# Patient Record
Sex: Female | Born: 1955 | Race: White | Hispanic: No | Marital: Married | Smoking: Never smoker
Health system: Southern US, Community
[De-identification: ages and names within clinical notes are randomized; demographics above are authoritative.]

## PROBLEM LIST (undated history)

## (undated) DIAGNOSIS — K5790 Diverticulosis of intestine, part unspecified, without perforation or abscess without bleeding: Secondary | ICD-10-CM

## (undated) HISTORY — DX: Diverticulosis of intestine, part unspecified, without perforation or abscess without bleeding: K57.90

---

## 2017-01-05 ENCOUNTER — Emergency Department (HOSPITAL_COMMUNITY)
Admission: EM | Admit: 2017-01-05 | Discharge: 2017-01-05 | Disposition: A | Discharge status: Home / Self Care | Payer: Managed Care, Other (non HMO) | Attending: Emergency Medicine | Admitting: Emergency Medicine

## 2017-01-05 ENCOUNTER — Emergency Department (HOSPITAL_COMMUNITY): Payer: Managed Care, Other (non HMO)

## 2017-01-05 ENCOUNTER — Encounter (HOSPITAL_COMMUNITY): Payer: Self-pay | Admitting: Emergency Medicine

## 2017-01-05 DIAGNOSIS — Y929 Unspecified place or not applicable: Secondary | ICD-10-CM | POA: Diagnosis not present

## 2017-01-05 DIAGNOSIS — Y999 Unspecified external cause status: Secondary | ICD-10-CM | POA: Diagnosis not present

## 2017-01-05 DIAGNOSIS — Z79899 Other long term (current) drug therapy: Secondary | ICD-10-CM | POA: Insufficient documentation

## 2017-01-05 DIAGNOSIS — M546 Pain in thoracic spine: Secondary | ICD-10-CM

## 2017-01-05 DIAGNOSIS — W109XXA Fall (on) (from) unspecified stairs and steps, initial encounter: Secondary | ICD-10-CM | POA: Insufficient documentation

## 2017-01-05 DIAGNOSIS — Y9301 Activity, walking, marching and hiking: Secondary | ICD-10-CM | POA: Insufficient documentation

## 2017-01-05 DIAGNOSIS — W19XXXA Unspecified fall, initial encounter: Secondary | ICD-10-CM

## 2017-01-05 MED ORDER — TRAMADOL HCL 50 MG PO TABS: 50.0000 mg | ORAL_TABLET | Freq: Four times a day (QID) | ORAL | 0 refills | Status: AC | PRN

## 2017-01-05 NOTE — ED Triage Notes (Signed)
[place] Staying at daughters house for visit. Pt was going downstairs and slipped and fell. No dizziness or syncopy precipitated the fall.  Pt fell directly onto her bottom and felt an immediate sharp pain in back upon impact.  Pt never fell backwards or hit her back, it was more compression.  Pain was extreme, 10/10.  Pt was immobilized in place by EMS and carried from the stairs. Position has not changed since then.  Pt does not c/o numbness or tingling.  20  Left hand. Pt has received a total 150 mcg of fentanyl en route. Alert and oriented x 4.

## 2017-01-05 NOTE — ED Notes (Signed)
Bed: WA07 Expected date:  Expected time:  Means of arrival:  Comments: fall 

## 2017-01-05 NOTE — ED Notes (Addendum)
Patient ambulated to restroom with minimal pain with assistance.

## 2017-01-05 NOTE — Discharge Instructions (Signed)
Please read attached information. If you experience any new or worsening signs or symptoms please return to the emergency room for evaluation. Please follow-up with your primary care provider or specialist as discussed. Please use medication prescribed only as directed and discontinue taking if you have any concerning signs or symptoms.   °

## 2017-01-05 NOTE — ED Provider Notes (Signed)
[facility]-EMERGENCY DEPT Provider Note   CSN: [id number] Arrival date & time: 01/05/17  1228     History   Chief Complaint Chief Complaint  Patient presents with  . Fall    HPI Carmen Levine is a 61 y.o. female.  HPI    61 year old female presents today status post fall.  Patient is she is walking down steps when she slipped and landed on her bottom.  She notes immediate pain to the thoracic region of her spine.  She denies any pain from the site of the fall, denies any neurological deficits to the upper and lower extremities, denies any shortness of breath or chest pain.  Patient does note a significant past medical history of discectomy at L4-L5.  Full active range of motion of bilateral upper and lower extremities.  Patient brought via EMS, 150 fentanyl given in route.   History reviewed. No pertinent past medical history.  There are no active problems to display for this patient.   History reviewed. No pertinent surgical history.  OB History    No data available       Home Medications    Prior to Admission medications   Medication Sig Start Date End Date Taking? Authorizing Provider  traMADol (ULTRAM) 50 MG tablet Take 1 tablet (50 mg total) by mouth every 6 (six) hours as needed. 01/05/17   Eyvonne MechanicJeffrey Ellyana Crigler, PA-C    Family History History reviewed. No pertinent family history.  Social History Social History  Substance Use Topics  . Smoking status: Never Smoker  . Smokeless tobacco: Never Used  . Alcohol use Not on file     Allergies   Patient has no allergy information on record.   Review of Systems Review of Systems  All other systems reviewed and are negative.    Physical Exam Updated Vital Signs BP 131/77 (BP Location: Left Arm)   Pulse 71   Temp 98.7 F (37.1 C) (Oral)   Resp 16   Ht 5\' 7"  (1.702 m)   Wt 70.3 kg   SpO2 95%   BMI 24.28 kg/m   Physical Exam  Constitutional: She is oriented to person, place, and time. She appears  well-developed and well-nourished.  HENT:  Head: Normocephalic and atraumatic.  Eyes: Conjunctivae are normal. Pupils are equal, round, and reactive to light. Right eye exhibits no discharge. Left eye exhibits no discharge. No scleral icterus.  Neck: Normal range of motion. No JVD present. No tracheal deviation present.  Pulmonary/Chest: Effort normal. No stridor.  Musculoskeletal:  Cervical spine tenderness.  Moderate tenderness to palpation of the midthoracic spine and surrounding soft tissue, no lumbar spinal tenderness to palpation.  Bilateral upper and lower extremity strength 5 out of 5, sensation grossly intact.  Full active range of motion of extremity  Neurological: She is alert and oriented to person, place, and time. Coordination normal.  Psychiatric: She has a normal mood and affect. Her behavior is normal. Judgment and thought content normal.  Nursing note and vitals reviewed.   ED Treatments / Results  Labs (all labs ordered are listed, but only abnormal results are displayed) Labs Reviewed - No data to display  EKG  EKG Interpretation None       Radiology Dg Thoracic Spine 2 View  Result Date: 01/05/2017 CLINICAL DATA:  Pain following fall EXAM: THORACIC SPINE 3 VIEWS COMPARISON:  None. FINDINGS: Frontal, lateral, and swimmer's views were obtained. There is no fracture or spondylolisthesis. The disc spaces appear normal. No erosive change or paraspinous  lesion. IMPRESSION: No fracture or spondylolisthesis.  No evident arthropathic change. Electronically Signed   By: Bretta Bang III M.D.   On: 01/05/2017 13:42    Procedures Procedures (including critical care time)  Medications Ordered in ED Medications - No data to display   Initial Impression / Assessment and Plan / ED Course  I have reviewed the triage vital signs and the nursing notes.  Pertinent labs & imaging results that were available during my care of the patient were reviewed by me and  considered in my medical decision making (see chart for details).     Final Clinical Impressions(s) / ED Diagnoses   Final diagnoses:  Fall, initial encounter  Acute bilateral thoracic back pain    Labs:    Imaging: DG thoracic   Consults:  Therapeutics:  Discharge Meds: Ultram   Assessment/Plan:  61 year old female status post fall.  Patient has thoracic pain.  She has no neurological deficits.  She ambulates without significant difficulty.  No acute fractures on plain films.  Patient will be discharged home with orthopedic follow-up and strict return precautions.  She verbalized understanding and agreement to today's plan had no further questions or concerns at the time discharge.     New Prescriptions New Prescriptions   TRAMADOL (ULTRAM) 50 MG TABLET    Take 1 tablet (50 mg total) by mouth every 6 (six) hours as needed.     Eyvonne Mechanic, PA-C 01/05/17 1450    Azalia Bilis, MD 01/05/17 [phone]

## 2018-04-19 IMAGING — CR DG THORACIC SPINE 2V
3 series · 3 of 3 positions shown · non-contrast
Comparison: None.

CLINICAL DATA: Pain following fall

EXAM:
THORACIC SPINE 3 VIEWS

[t thoracic spine ap]
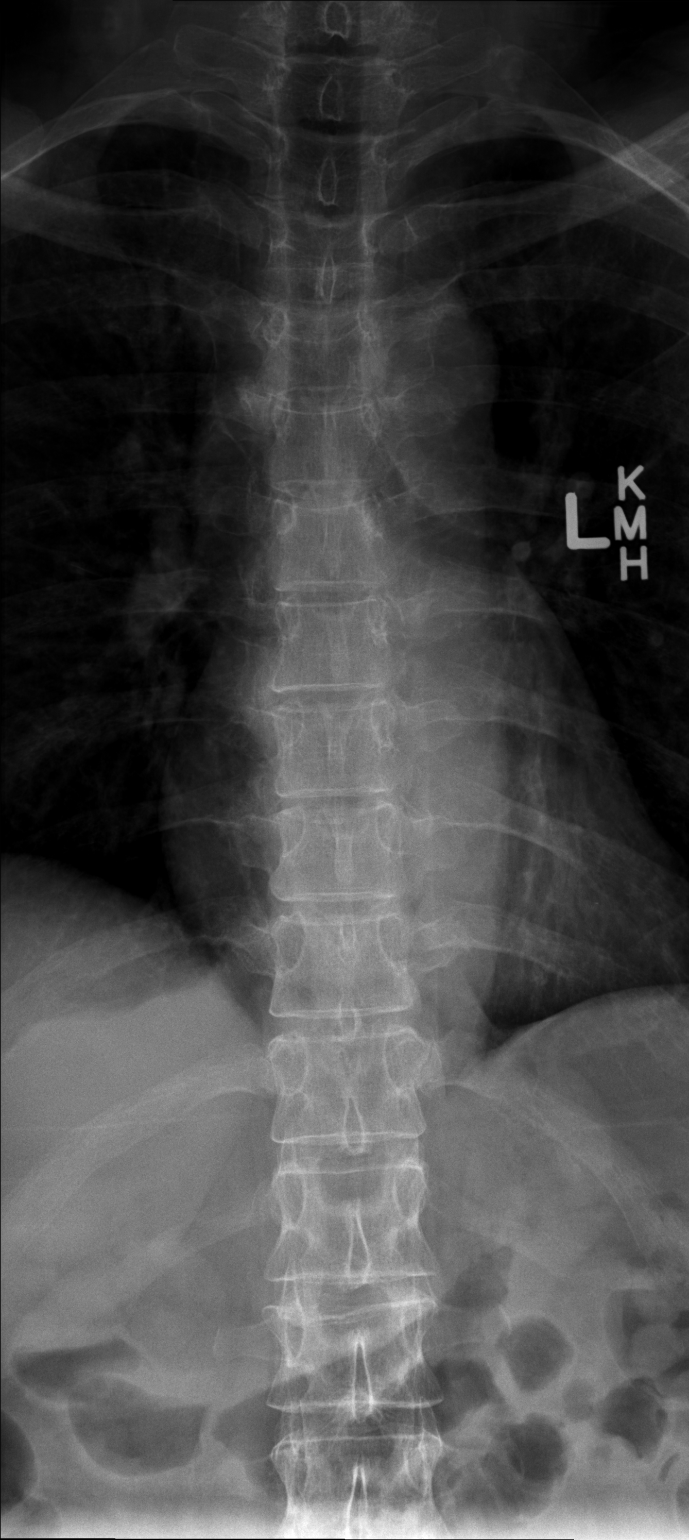

[t thoracic spine lat]
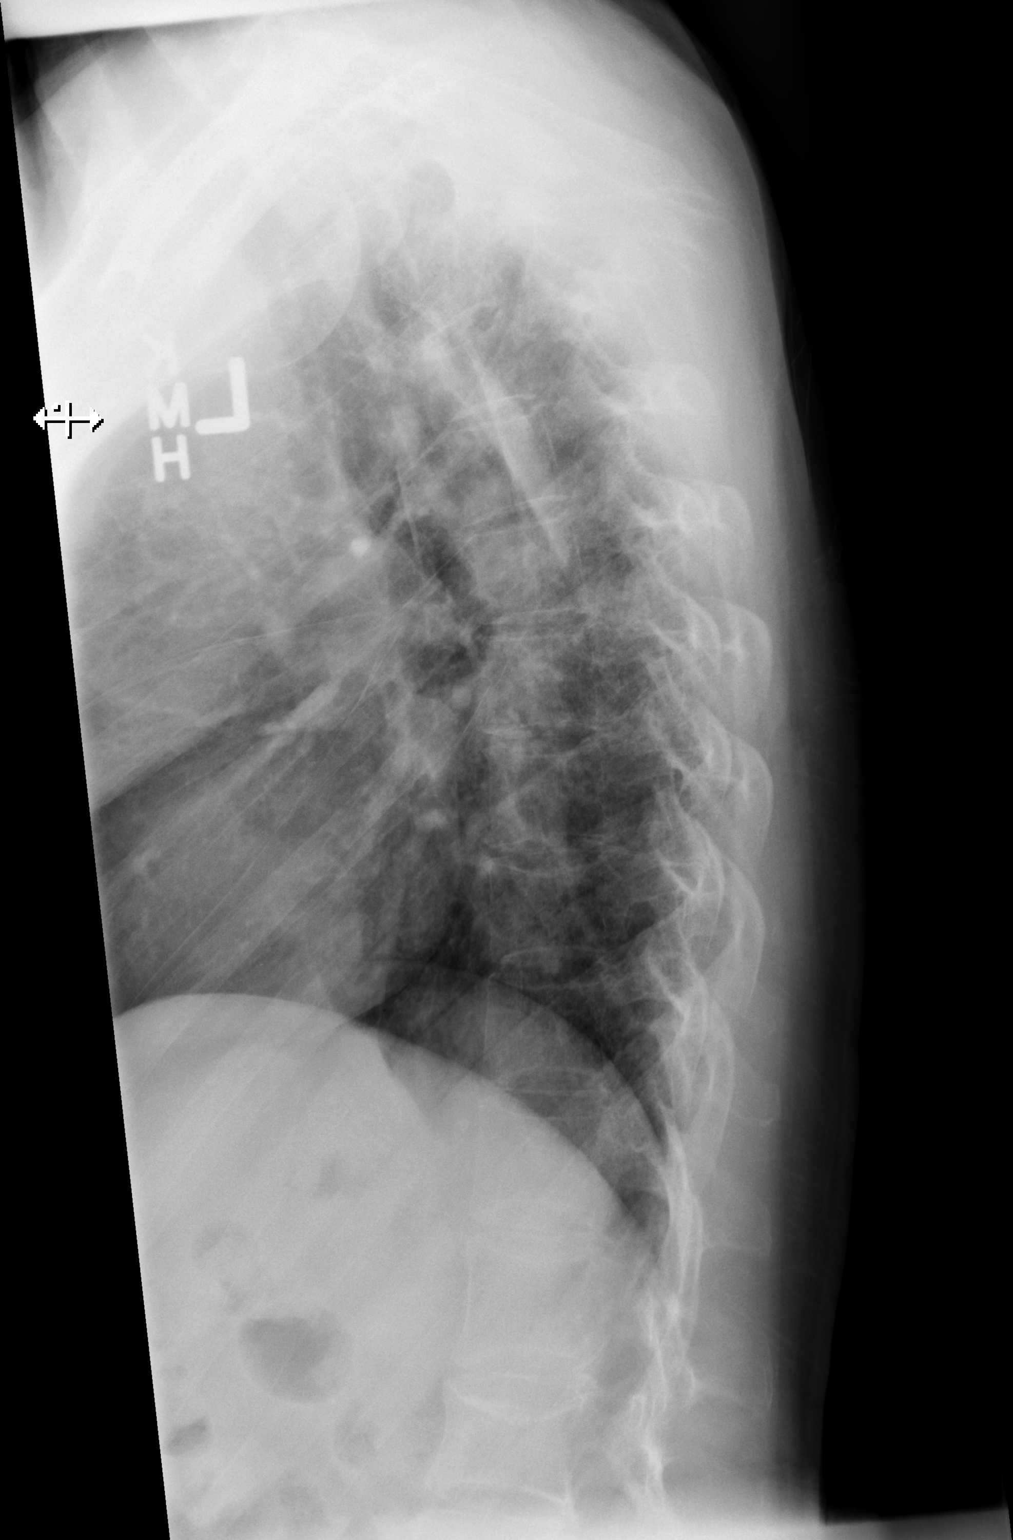

[t thoracic swimmers]
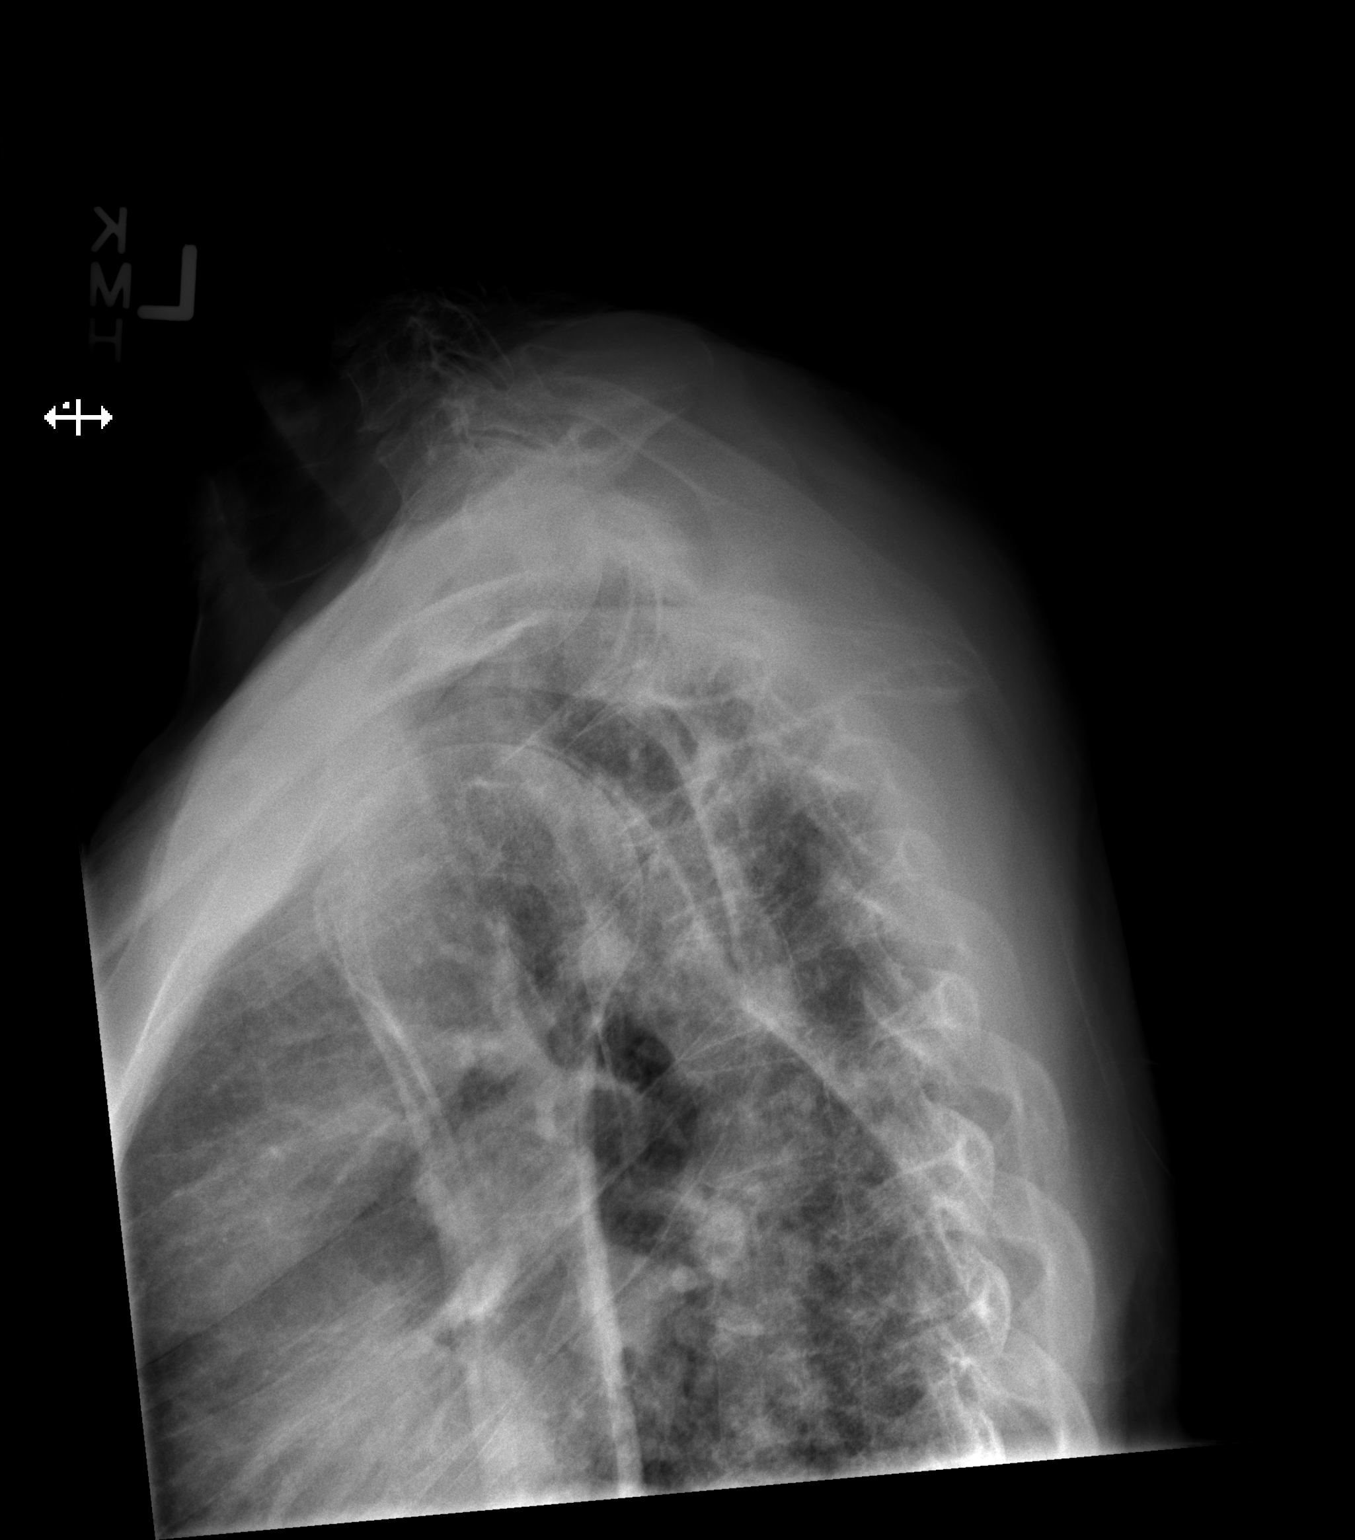

[3 of 3 positions shown; findings below may reference images not displayed]

FINDINGS: Frontal, lateral, and swimmer's views were obtained. There is no
fracture or spondylolisthesis. The disc spaces appear normal. No
erosive change or paraspinous lesion.
IMPRESSION: No fracture or spondylolisthesis.  No evident arthropathic change.

## 2022-05-24 ENCOUNTER — Other Ambulatory Visit: Payer: Self-pay | Admitting: Family Medicine

## 2022-05-24 ENCOUNTER — Ambulatory Visit
Admission: RE | Admit: 2022-05-24 | Discharge: 2022-05-24 | Disposition: A | Discharge status: Home / Self Care | Payer: Medicare Other | Source: Ambulatory Visit | Attending: Family Medicine | Admitting: Family Medicine

## 2022-05-24 DIAGNOSIS — K5792 Diverticulitis of intestine, part unspecified, without perforation or abscess without bleeding: Secondary | ICD-10-CM

## 2022-05-24 MED ORDER — IOPAMIDOL (ISOVUE-300) INJECTION 61%
100.0000 mL | Freq: Once | INTRAVENOUS | Status: AC | PRN
  Administered 2022-05-24: 100 mL via INTRAVENOUS

## 2022-05-25 ENCOUNTER — Emergency Department (HOSPITAL_COMMUNITY)
Admission: EM | Admit: 2022-05-25 | Discharge: 2022-05-25 | Disposition: A | Discharge status: Home / Self Care | Payer: Medicare Other | Attending: Emergency Medicine | Admitting: Emergency Medicine

## 2022-05-25 ENCOUNTER — Encounter (HOSPITAL_COMMUNITY): Payer: Self-pay | Admitting: Emergency Medicine

## 2022-05-25 DIAGNOSIS — K5792 Diverticulitis of intestine, part unspecified, without perforation or abscess without bleeding: Secondary | ICD-10-CM

## 2022-05-25 DIAGNOSIS — R1084 Generalized abdominal pain: Secondary | ICD-10-CM | POA: Diagnosis present

## 2022-05-25 DIAGNOSIS — K5732 Diverticulitis of large intestine without perforation or abscess without bleeding: Secondary | ICD-10-CM | POA: Insufficient documentation

## 2022-05-25 LAB — COMPREHENSIVE METABOLIC PANEL
ALT: 16 U/L (ref 0–44)
AST: 19 U/L (ref 15–41)
Albumin: 3.5 g/dL (ref 3.5–5.0)
Alkaline Phosphatase: 68 U/L (ref 38–126)
Anion gap: 12 (ref 5–15)
BUN: 15 mg/dL (ref 8–23)
CO2: 24 mmol/L (ref 22–32)
Calcium: 9.6 mg/dL (ref 8.9–10.3)
Chloride: 100 mmol/L (ref 98–111)
Creatinine, Ser: 0.95 mg/dL (ref 0.44–1.00)
GFR, Estimated: >60 mL/min (ref >60)
Glucose, Bld: 130 mg/dL — ABNORMAL HIGH (ref 70–99)
Potassium: 3.4 mmol/L — ABNORMAL LOW (ref 3.5–5.1)
Sodium: 136 mmol/L (ref 135–145)
Total Bilirubin: 1 mg/dL (ref 0.3–1.2)
Total Protein: 7.3 g/dL (ref 6.5–8.1)

## 2022-05-25 LAB — URINALYSIS, ROUTINE W REFLEX MICROSCOPIC
Bilirubin Urine: NEGATIVE
Glucose, UA: NEGATIVE mg/dL
Hgb urine dipstick: NEGATIVE
Ketones, ur: NEGATIVE mg/dL
Leukocytes,Ua: NEGATIVE
Nitrite: NEGATIVE
Protein, ur: NEGATIVE mg/dL
Specific Gravity, Urine: 1.019 (ref 1.005–1.030)
pH: 7 (ref 5.0–8.0)

## 2022-05-25 LAB — CBC WITH DIFFERENTIAL/PLATELET
Abs Immature Granulocytes: 0.02 10*3/uL (ref 0.00–0.07)
Basophils Absolute: 0.1 10*3/uL (ref 0.0–0.1)
Basophils Relative: 1 %
Eosinophils Absolute: 0.1 10*3/uL (ref 0.0–0.5)
Eosinophils Relative: 1 %
HCT: 39.7 % (ref 36.0–46.0)
Hemoglobin: 13.5 g/dL (ref 12.0–15.0)
Immature Granulocytes: 0 %
Lymphocytes Relative: 21 %
Lymphs Abs: 1.8 10*3/uL (ref 0.7–4.0)
MCH: 31 pg (ref 26.0–34.0)
MCHC: 34 g/dL (ref 30.0–36.0)
MCV: 91.1 fL (ref 80.0–100.0)
Monocytes Absolute: 0.5 10*3/uL (ref 0.1–1.0)
Monocytes Relative: 6 %
Neutro Abs: 6 10*3/uL (ref 1.7–7.7)
Neutrophils Relative %: 71 %
Platelets: 383 10*3/uL (ref 150–400)
RBC: 4.36 MIL/uL (ref 3.87–5.11)
RDW: 11.7 % (ref 11.5–15.5)
WBC: 8.4 10*3/uL (ref 4.0–10.5)
nRBC: 0 % (ref 0.0–0.2)

## 2022-05-25 LAB — LIPASE, BLOOD: Lipase: 49 U/L (ref 11–51)

## 2022-05-25 MED ORDER — METRONIDAZOLE 500 MG PO TABS: 500.0000 mg | ORAL_TABLET | Freq: Three times a day (TID) | ORAL | 0 refills | Status: AC

## 2022-05-25 MED ORDER — CIPROFLOXACIN HCL 500 MG PO TABS: 500.0000 mg | ORAL_TABLET | Freq: Two times a day (BID) | ORAL | 0 refills | Status: AC

## 2022-05-25 MED ORDER — CIPROFLOXACIN HCL 500 MG PO TABS: 500.0000 mg | ORAL_TABLET | Freq: Two times a day (BID) | ORAL | 0 refills | Status: DC

## 2022-05-25 MED ORDER — DICYCLOMINE HCL 10 MG PO CAPS
10.0000 mg | ORAL_CAPSULE | Freq: Once | ORAL | Status: DC
  Filled 2022-05-25: qty 1

## 2022-05-25 NOTE — ED Triage Notes (Signed)
Patient here with complaint of diverticulitis, history of same. Patient denies fevers, reports increase in pain with bowel movements.

## 2022-05-25 NOTE — ED Provider Triage Note (Signed)
Emergency Medicine Provider Triage Evaluation Note  Carmen Levine , a 66 y.o. female  was evaluated in triage.  Pt complains of abdominal pain.  Patient states that she has a history of diverticulitis and was seen by her primary care provider on 05/09/2022 for similar symptoms.  She was prescribed Augmentin of which she took 10-day course with no relief of symptoms.  She is attempted liquid diet which has helped some.  Her symptoms include lower abdominal "crampy pain."  She has noticed mucousy stools.  She went to her primary care provider here today CT abdomen pelvis yesterday which showed evidence of acute diverticulitis or proctocolitis.  She was sent by her PCP to the emergency department for further evaluation.  Denies fever, chills, night sweats, chest pain, shortness of breath, vomiting, hematochezia, melena, hematemesis, urinary symptoms, vaginal symptoms.  Review of Systems  Positive: See above Negative:   Physical Exam  BP (!) 155/93 (BP Location: Right Arm)   Pulse 96   Temp 98.2 F (36.8 C) (Oral)   Resp 16   SpO2 97%  Gen:   Awake, no distress   Resp:  Normal effort  MSK:   Moves extremities without difficulty  Other:  Lower abdominal tenderness.  Murphy sign negative.  No CVA tenderness bilaterally.  Medical Decision Making  Medically screening exam initiated at 1:10 PM.  Appropriate orders placed.  Carmen Levine was informed that the remainder of the evaluation will be completed by another provider, this initial triage assessment does not replace that evaluation, and the importance of remaining in the ED until their evaluation is complete.     Peter Garter, [place] 05/25/22 1313

## 2022-05-25 NOTE — ED Provider Notes (Signed)
[facility] EMERGENCY DEPARTMENT Provider Note   CSN: [id number] Arrival date & time: 05/25/22  1245     History  Chief Complaint  Patient presents with   Abdominal Pain    Carmen Levine is a 66 y.o. female with history of recurrent diverticulitis presenting today due to abdominal pain, diarrhea, and abnormal CT findings.  Abdominal pain started about 2 to 3 weeks ago, similar but slightly different to her typical diverticulitis presentation.  Diffuse abdominal tenderness without N/V, fever, chills, flank pain, or back pain.  Denies bloody stools, but endorsed a few days of increased mucus in the stool.  Was provided a 10-day course of Augmentin, which she finished this past Saturday.  Did not notice any improvement.  Was sent for CT abdomen and pelvis, which indicated likely diverticulitis.  Was recommended to come to the ED.  The history is provided by the patient and medical records.  Abdominal Pain      Home Medications Prior to Admission medications   Medication Sig Start Date End Date Taking? Authorizing Provider  metroNIDAZOLE (FLAGYL) 500 MG tablet Take 1 tablet (500 mg total) by mouth 3 (three) times daily. One po bid x 7 days 05/25/22  Yes Kathie Dike M, PA-C  ciprofloxacin (CIPRO) 500 MG tablet Take 1 tablet (500 mg total) by mouth 2 (two) times daily for 5 days. 05/25/22 05/30/22  Cecil Cobbs, PA-C  traMADol (ULTRAM) 50 MG tablet Take 1 tablet (50 mg total) by mouth every 6 (six) hours as needed. 01/05/17   Eyvonne Mechanic, PA-C      Allergies    Patient has no known allergies.    Review of Systems   Review of Systems  Gastrointestinal:  Positive for abdominal pain.    Physical Exam Updated Vital Signs BP (!) 165/76   Pulse 76   Temp 97.6 F (36.4 C)   Resp 16   SpO2 100%  Physical Exam Vitals and nursing note reviewed.  Constitutional:      General: She is not in acute distress.    Appearance: She is well-developed. She is not  ill-appearing, toxic-appearing or diaphoretic.  HENT:     Head: Normocephalic and atraumatic.     Mouth/Throat:     Pharynx: Oropharynx is clear.  Eyes:     General: No scleral icterus.    Conjunctiva/sclera: Conjunctivae normal.  Cardiovascular:     Rate and Rhythm: Normal rate and regular rhythm.     Heart sounds: No murmur heard. Pulmonary:     Effort: Pulmonary effort is normal. No respiratory distress.     Breath sounds: Normal breath sounds.  Chest:     Chest wall: No tenderness.  Abdominal:     General: Abdomen is protuberant. Bowel sounds are normal. There is no distension.     Palpations: Abdomen is soft.     Tenderness: There is generalized abdominal tenderness. There is no guarding or rebound. Negative signs include Murphy's sign and McBurney's sign.  Musculoskeletal:        General: No swelling.     Cervical back: Neck supple.  Skin:    General: Skin is warm and dry.     Capillary Refill: Capillary refill takes less than 2 seconds.     Coloration: Skin is not jaundiced or pale.  Neurological:     Mental Status: She is alert and oriented to person, place, and time.  Psychiatric:        Mood and Affect: Mood normal.  ED Results / Procedures / Treatments   Labs (all labs ordered are listed, but only abnormal results are displayed) Labs Reviewed  COMPREHENSIVE METABOLIC PANEL - Abnormal; Notable for the following components:      Result Value   Potassium 3.4 (*)    Glucose, Bld 130 (*)    All other components within normal limits  CBC WITH DIFFERENTIAL/PLATELET  LIPASE, BLOOD  URINALYSIS, ROUTINE W REFLEX MICROSCOPIC    EKG None  Radiology CT ABDOMEN PELVIS W CONTRAST  Result Date: 05/24/2022 CLINICAL DATA:  Low abdominal cramping. Loss of appetite. Increased belching. EXAM: CT ABDOMEN AND PELVIS WITH CONTRAST TECHNIQUE: Multidetector CT imaging of the abdomen and pelvis was performed using the standard protocol following bolus administration of  intravenous contrast. RADIATION DOSE REDUCTION: This exam was performed according to the departmental dose-optimization program which includes automated exposure control, adjustment of the mA and/or kV according to patient size and/or use of iterative reconstruction technique. CONTRAST:  ISOVUE-300 IOPAMIDOL (ISOVUE-300) INJECTION 61% COMPARISON:  None Available. FINDINGS: Lower chest: No acute abnormality. Hepatobiliary: No focal liver abnormality is seen. No gallstones, gallbladder wall thickening, or biliary dilatation. Pancreas: Unremarkable. No pancreatic ductal dilatation or surrounding inflammatory changes. Spleen: Normal in size without focal abnormality. Adrenals/Urinary Tract: Adrenal glands are unremarkable. Kidneys are normal, without renal calculi, focal lesion, or hydronephrosis. Bladder is unremarkable. Stomach/Bowel: Small hiatal hernia. Stomach is otherwise unremarkable. Appendix appears normal. There is advanced sigmoid colonic diverticulosis. There is wall thickening of the distal sigmoid colon/rectum concerning for proctocolitis or diverticulitis. No extraluminal free air. No abdominal collection/abscess. Vascular/Lymphatic: No significant vascular findings are present. No enlarged abdominal or pelvic lymph nodes. Reproductive: Uterus and bilateral adnexa are unremarkable. Other: No abdominal wall hernia or abnormality. No abdominopelvic ascites. 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 IMPRESSION: 1. Advanced sigmoid colonic diverticulosis. There is sigmoid colonic and rectal wall thickening with adjacent fat stranding. This may be secondary to acute diverticulitis or proctocolitis. No extraluminal free air or abdominal collection/abscess. 2.  Normal appendix. 3.  No evidence of nephrolithiasis or hydronephrosis. 4. 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 Electronically Signed   By: Larose Hires  D.O.   On: 05/24/2022 11:29    Procedures Procedures    Medications Ordered in ED Medications - No data to display  ED Course/ Medical Decision Making/ A&P                           Medical Decision Making Risk Prescription drug management.   66 y.o. female presents to the ED for concern of Abdominal Pain   This involves an extensive number of treatment options, and is a complaint that carries with it a high risk of complications and morbidity.    Past Medical History / Co-morbidities / Social History: Hx of recurrent diverticulitis Social Determinants of Health include: Elderly  Additional History:  Internal and external records from outside source obtained and reviewed including family medicine notes, which indicates recent treatment of diverticulitis with Augmentin, and outpatient CT abdomen and pelvis yesterday  Lab Tests: I ordered, and personally interpreted labs.  The pertinent results include:   CBC, lipase, CMP, UA: Unremarkable  Imaging Studies: I reviewed recent outpatient imaging of CT abdomen/pelvis.  This indicated likely diverticulitis/proctocolitis, without evidence of free air or surrounding fluid/abscess. I agree with the radiologist interpretation.  ED Course: Pt well-appearing on exam.  Presenting  today with some LLQ abdominal discomfort.  Known Hx of diverticulosis/diverticulitis.  Presenting the same symptomology as previous episodes per patient.  Patient is nontoxic, nonseptic appearing, in no apparent distress.  Patient's pain and other symptoms adequately managed in ED.  Labs, history and vitals reviewed.  Patient does not meet the SIRS or Sepsis criteria.  Patient does not have a surgical abdomen and there are no peritoneal signs.  Recent outpatient CT imaging with indication of possible mild diverticulitis without evidence of abscess or surrounding complication or other acute intra-abdominal/pelvic pathology.  No recent antibiotic use.  Considered  admission, however I believe this may be managed with outpatient course of antibiotics and close PCP follow-up at this time.  Continued conservative symptomatic management discussed.  Patient satisfied with today's encounter.  Patient in NAD and in good condition at time of discharge.  Disposition: After consideration the patient's encounter today, I do not feel today's workup suggests an emergent condition requiring admission or immediate intervention beyond what has been performed at this time.  Safe for discharge; instructed to return immediately for worsening symptoms, change in symptoms or any other concerns.  I have reviewed the patients home medicines and have made adjustments as needed.  Discussed course of treatment with the patient, whom demonstrated understanding.  Patient in agreement and has no further questions.     This chart was dictated using voice recognition software.  Despite best efforts to proofread, errors can occur which can change the documentation meaning.         Final Clinical Impression(s) / ED Diagnoses Final diagnoses:  Diverticulitis    Rx / DC Orders ED Discharge Orders          Ordered    metroNIDAZOLE (FLAGYL) 500 MG tablet  3 times daily        05/25/22 1859    ciprofloxacin (CIPRO) 500 MG tablet  2 times daily        05/25/22 1859              Cecil Cobbs, PA-C 05/29/22 3614    Benjiman Core, MD 05/30/22 [phone]

## 2022-05-25 NOTE — Discharge Instructions (Addendum)
2 antibiotics have been sent to your pharmacy.  The first is Flagyl, which you will take 3 times daily for the next 7 days.  The second is ciprofloxacin, which she would take twice a day for 5 days.  Always take with plenty of food and water.  You may also take a probiotic/probiotic yogurt daily to help reduce the risk of an antibiotic associated UTI.  Please also note that ciprofloxacin has an increased risk of C. difficile infection.  I have also provided contact information for a local gastroenterology office.  Please contact them to schedule a follow-up appointment for continued care of your recurrent diverticulitis.  Follow-up with your PCP within the next 2 to 3 days for reevaluation continue medical management.  Return to the ED for new or worsening symptoms as discussed.

## 2024-11-20 ENCOUNTER — Other Ambulatory Visit (HOSPITAL_BASED_OUTPATIENT_CLINIC_OR_DEPARTMENT_OTHER): Payer: Self-pay | Admitting: Family Medicine

## 2024-11-20 DIAGNOSIS — M810 Age-related osteoporosis without current pathological fracture: Secondary | ICD-10-CM

## 2025-01-15 ENCOUNTER — Other Ambulatory Visit
